# Patient Record
Sex: Female | Born: 1975 | Race: White | Hispanic: No | Marital: Married | Smoking: Current some day smoker
Health system: Southern US, Community
[De-identification: ages and names within clinical notes are randomized; demographics above are authoritative.]

## PROBLEM LIST (undated history)

## (undated) DIAGNOSIS — J309 Allergic rhinitis, unspecified: Secondary | ICD-10-CM

## (undated) HISTORY — PX: WISDOM TOOTH EXTRACTION: SHX21

## (undated) HISTORY — PX: CHOLECYSTECTOMY: SHX55

## (undated) HISTORY — DX: Allergic rhinitis, unspecified: J30.9

---

## 2009-05-10 ENCOUNTER — Ambulatory Visit (HOSPITAL_COMMUNITY): Admission: RE | Admit: 2009-05-10 | Discharge: 2009-05-10 | Payer: Self-pay | Admitting: Obstetrics and Gynecology

## 2009-05-28 ENCOUNTER — Ambulatory Visit (HOSPITAL_COMMUNITY): Admission: RE | Admit: 2009-05-28 | Discharge: 2009-05-28 | Payer: Self-pay | Admitting: Obstetrics and Gynecology

## 2009-06-13 ENCOUNTER — Ambulatory Visit (HOSPITAL_COMMUNITY): Admission: RE | Admit: 2009-06-13 | Discharge: 2009-06-13 | Payer: Self-pay | Admitting: Obstetrics and Gynecology

## 2009-06-26 ENCOUNTER — Ambulatory Visit (HOSPITAL_COMMUNITY): Admission: RE | Admit: 2009-06-26 | Discharge: 2009-06-26 | Payer: Self-pay | Admitting: Obstetrics and Gynecology

## 2009-07-12 ENCOUNTER — Ambulatory Visit (HOSPITAL_COMMUNITY): Admission: RE | Admit: 2009-07-12 | Discharge: 2009-07-12 | Payer: Self-pay | Admitting: Obstetrics and Gynecology

## 2009-08-07 ENCOUNTER — Ambulatory Visit (HOSPITAL_COMMUNITY): Admission: RE | Admit: 2009-08-07 | Discharge: 2009-08-07 | Payer: Self-pay | Admitting: Obstetrics and Gynecology

## 2009-09-03 ENCOUNTER — Ambulatory Visit (HOSPITAL_COMMUNITY): Admission: RE | Admit: 2009-09-03 | Discharge: 2009-09-03 | Payer: Self-pay | Admitting: Obstetrics and Gynecology

## 2009-10-09 ENCOUNTER — Inpatient Hospital Stay (HOSPITAL_COMMUNITY): Admission: AD | Admit: 2009-10-09 | Discharge: 2009-10-09 | Payer: Self-pay | Admitting: Obstetrics and Gynecology

## 2009-10-10 ENCOUNTER — Inpatient Hospital Stay (HOSPITAL_COMMUNITY): Admission: AD | Admit: 2009-10-10 | Discharge: 2009-10-14 | Payer: Self-pay | Admitting: Obstetrics and Gynecology

## 2009-10-11 ENCOUNTER — Encounter (INDEPENDENT_AMBULATORY_CARE_PROVIDER_SITE_OTHER): Payer: Self-pay | Admitting: Obstetrics and Gynecology

## 2010-03-27 LAB — CBC
HCT: 32 % — ABNORMAL LOW (ref 36.0–46.0)
HCT: 32 % — ABNORMAL LOW (ref 36.0–46.0)
Hemoglobin: 10.8 g/dL — ABNORMAL LOW (ref 12.0–15.0)
Hemoglobin: 14.1 g/dL (ref 12.0–15.0)
MCH: 30.5 pg (ref 26.0–34.0)
MCHC: 33.8 g/dL (ref 30.0–36.0)
MCV: 90.2 fL (ref 78.0–100.0)
Platelets: 226 10*3/uL (ref 150–400)
Platelets: 255 10*3/uL (ref 150–400)
RBC: 3.55 MIL/uL — ABNORMAL LOW (ref 3.87–5.11)
RDW: 14.1 % (ref 11.5–15.5)
WBC: 16 10*3/uL — ABNORMAL HIGH (ref 4.0–10.5)

## 2011-06-24 IMAGING — US US OB FOLLOW-UP
1 series · 18 of 28 positions shown · non-contrast
Comparison: none

OBSTETRICAL ULTRASOUND:
 This ultrasound was performed in The [HOSPITAL], and the [facility] report will be stored to [REDACTED] PACS.  This report is also available in [HOSPITAL]?s accessANYware.

[Series 1: us ob follow-up · 18 of 40 slices shown]
[im 1/40]
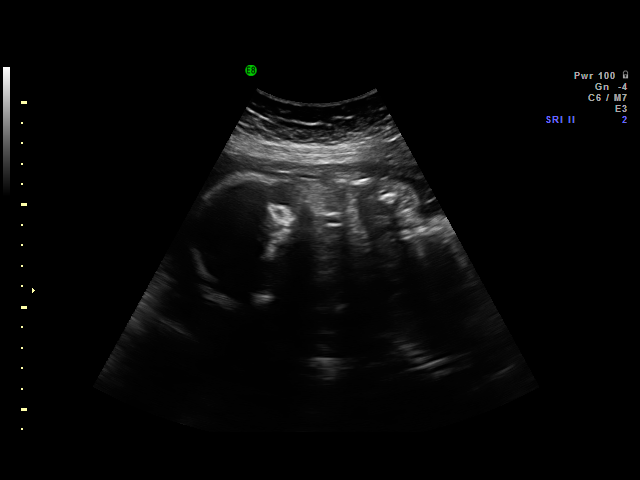
[im 3/40]
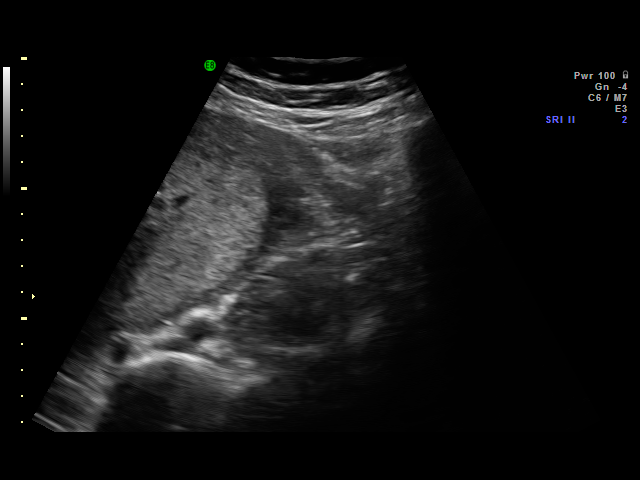
[im 5/40]
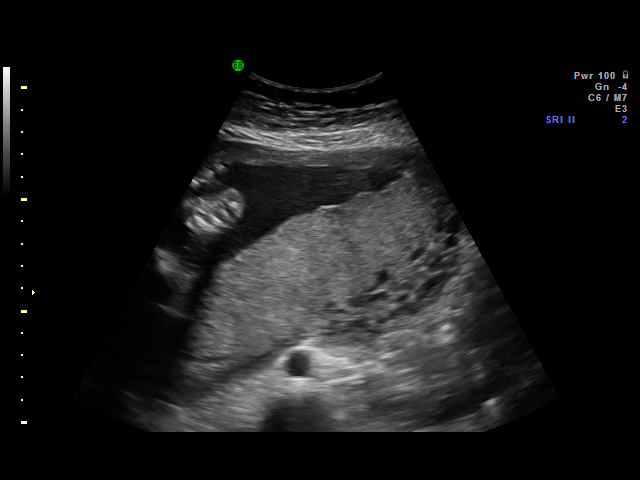
[im 8/40]
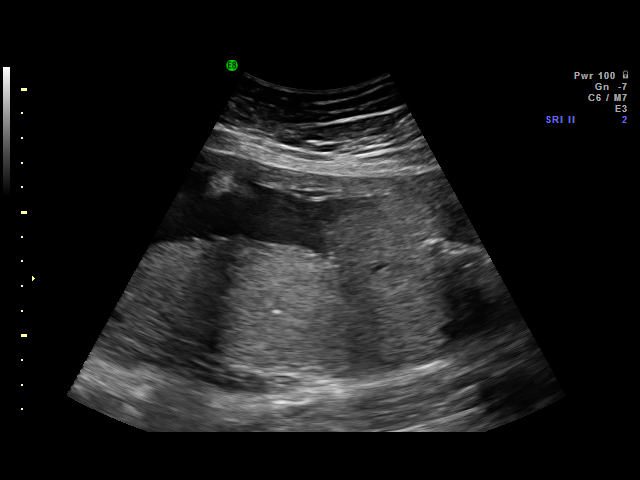
[im 11/40]
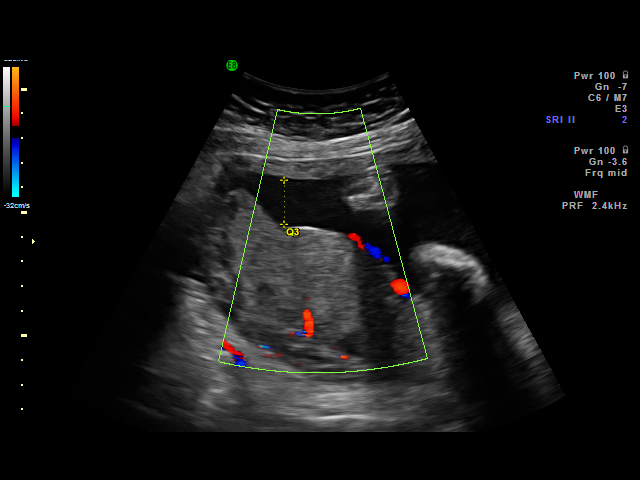
[im 12/40]
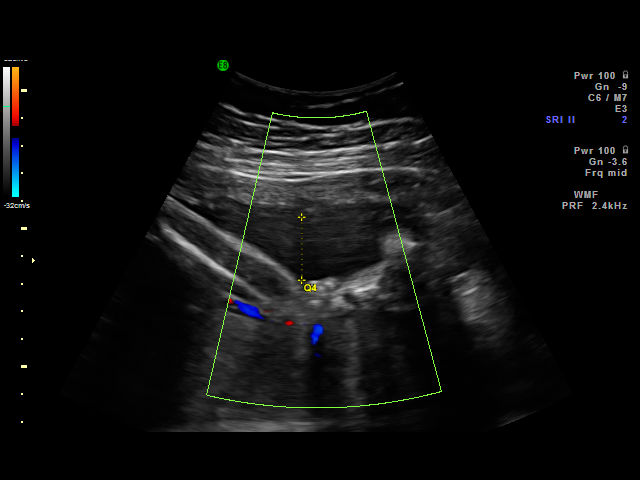
[im 15/40]
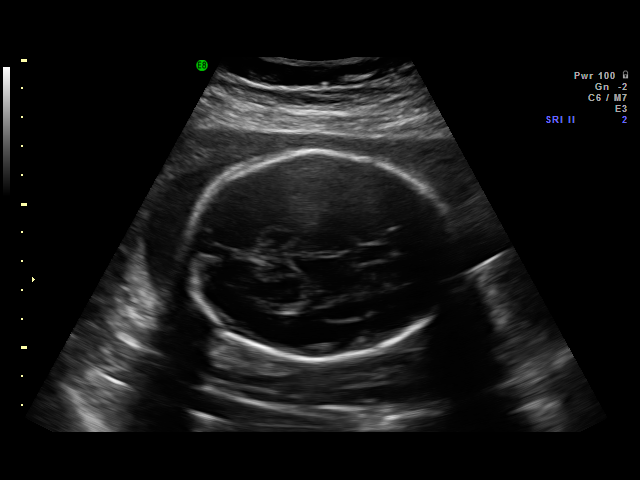
[im 16/40]
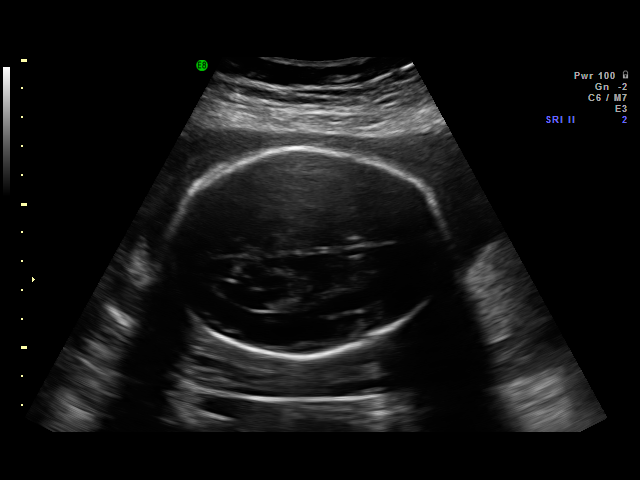
[im 19/40]
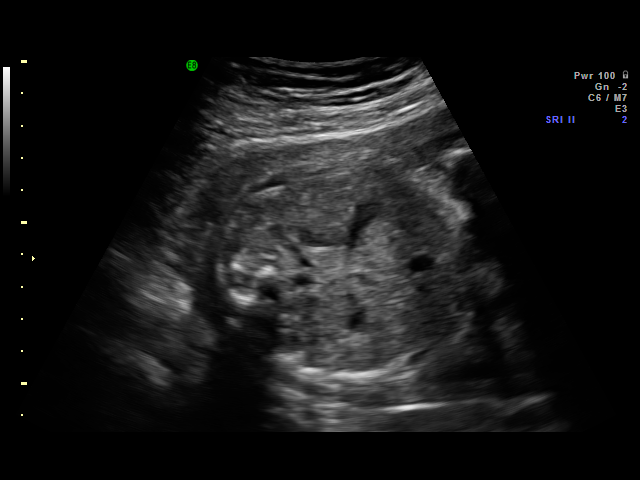
[im 21/40]
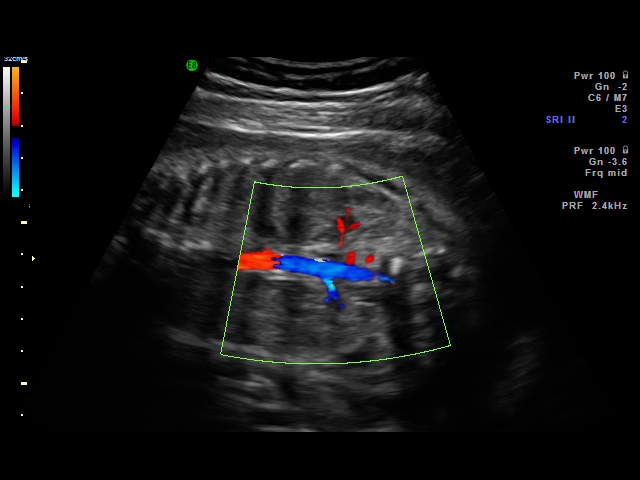
[im 24/40]
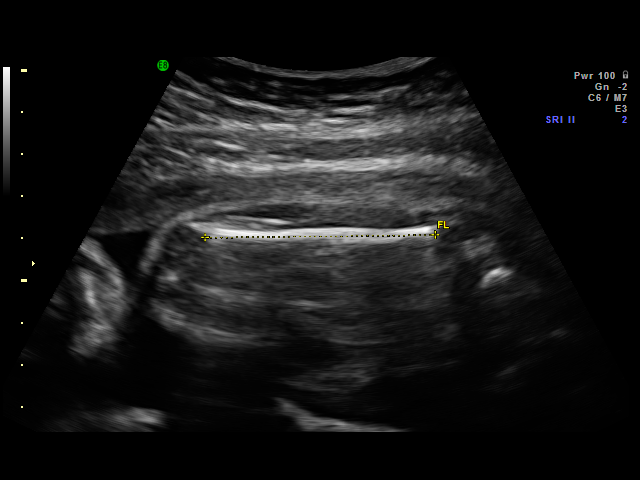
[im 25/40]
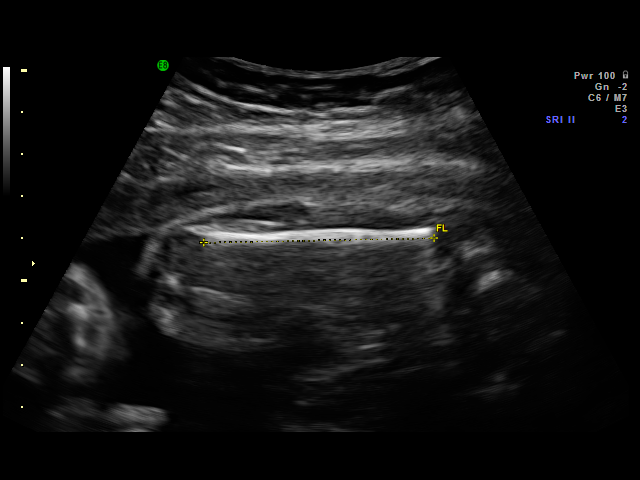
[im 28/40]
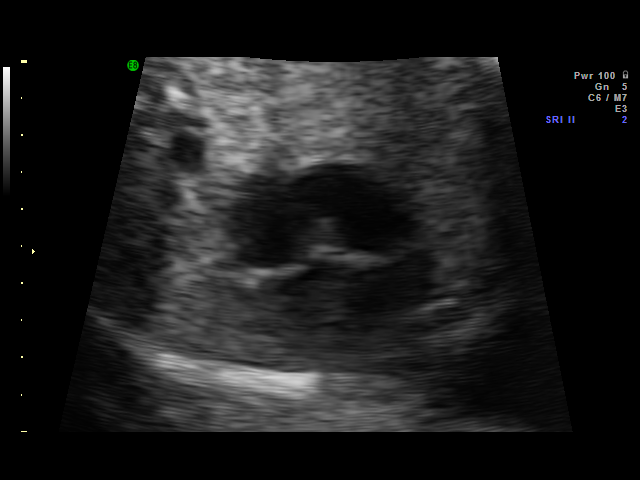
[im 31/40]
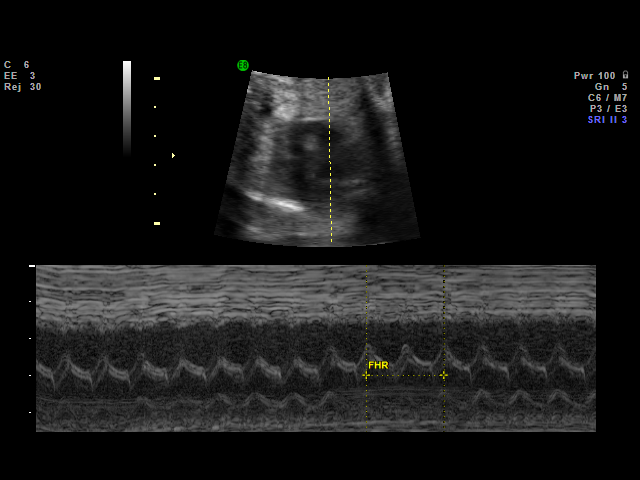
[im 32/40]
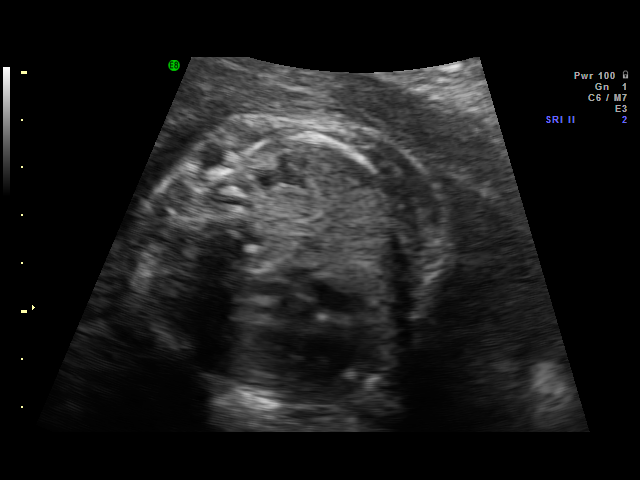
[im 35/40]
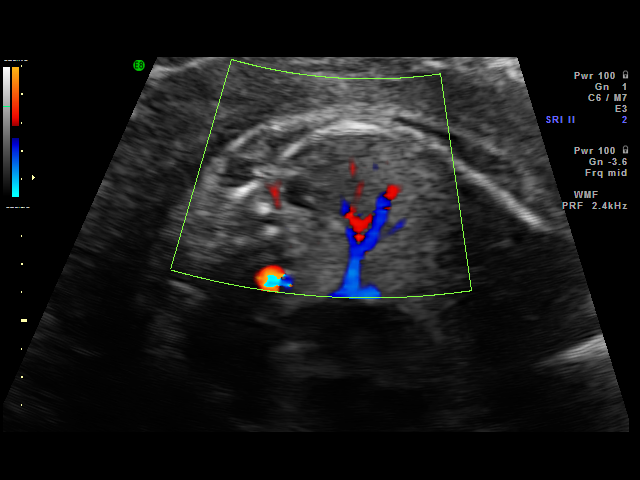
[im 37/40]
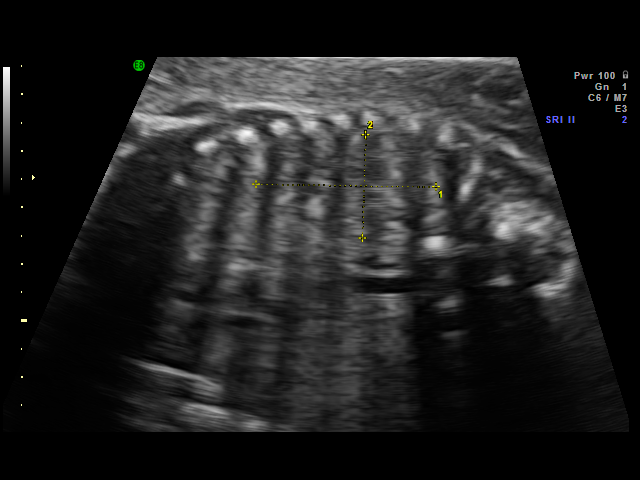
[im 40/40]
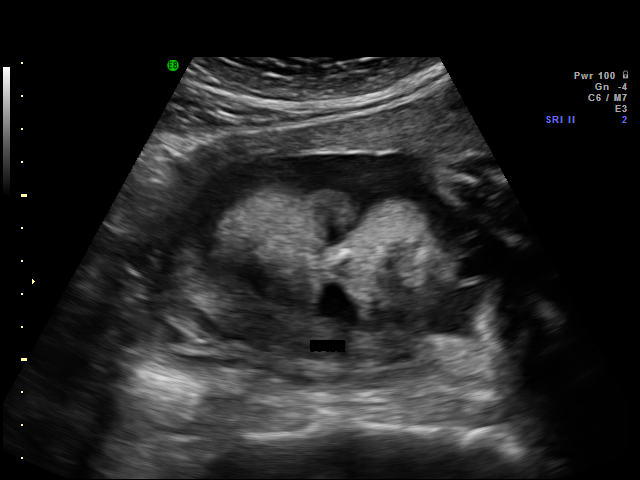

[18 of 28 positions shown; findings below may reference images not displayed]

IMPRESSION: [facility] has also been faxed to the ordering physician.

## 2011-07-20 IMAGING — US US OB FOLLOW-UP
2 series · 14 of 28 positions shown · non-contrast
Comparison: none

OBSTETRICAL ULTRASOUND:
 This ultrasound was performed in The [HOSPITAL], and the [facility] report will be stored to [REDACTED] PACS.  This report is also available in [HOSPITAL]?s accessANYware.

[Series 1: us ob follow-up · 0.27mm/px · 12 of 27 slices shown (1 of 2)]
[im 2/27]
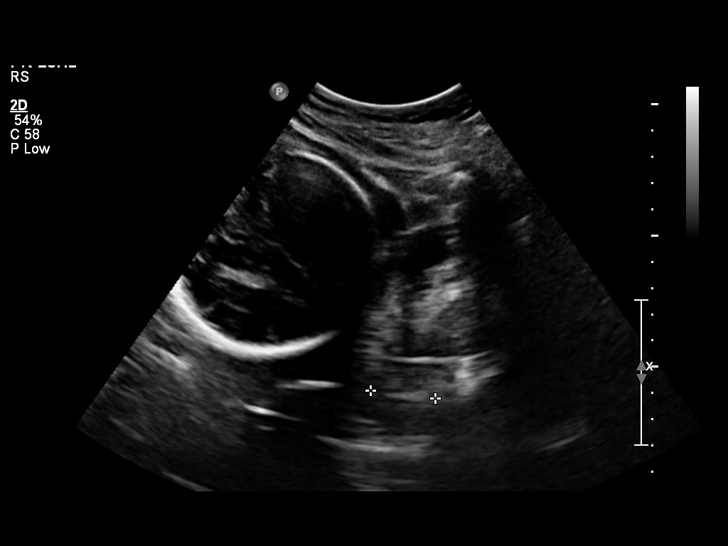
[im 4/27]
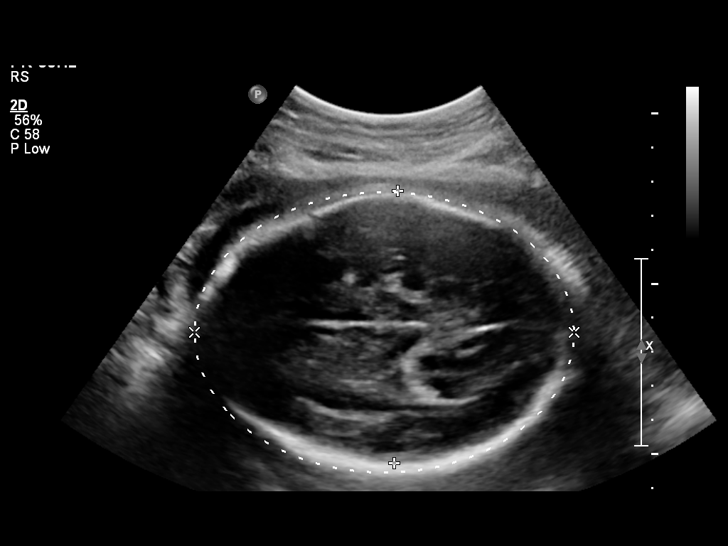
[im 6/27]
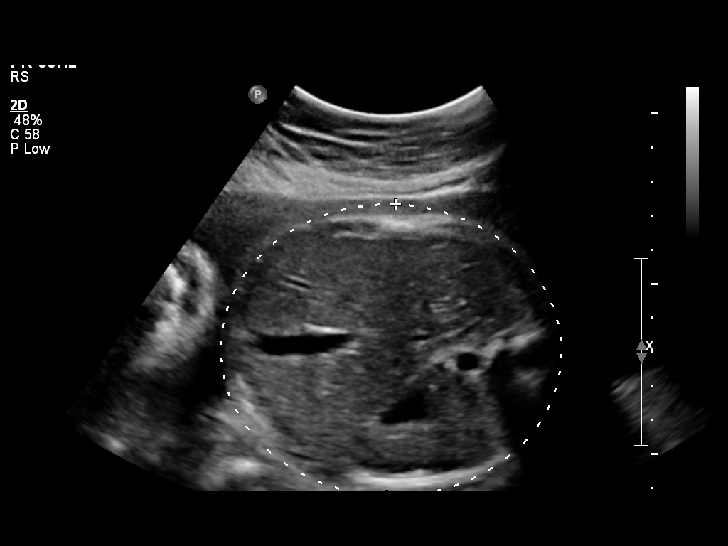
[im 8/27]
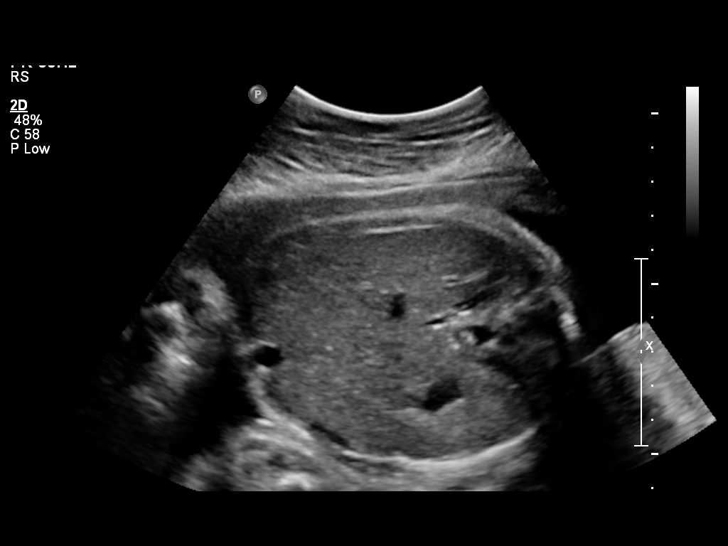
[im 11/27]
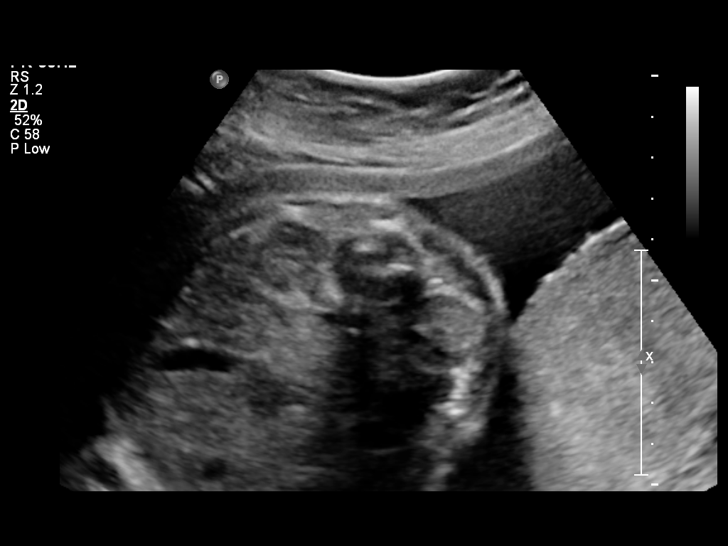
[im 13/27]
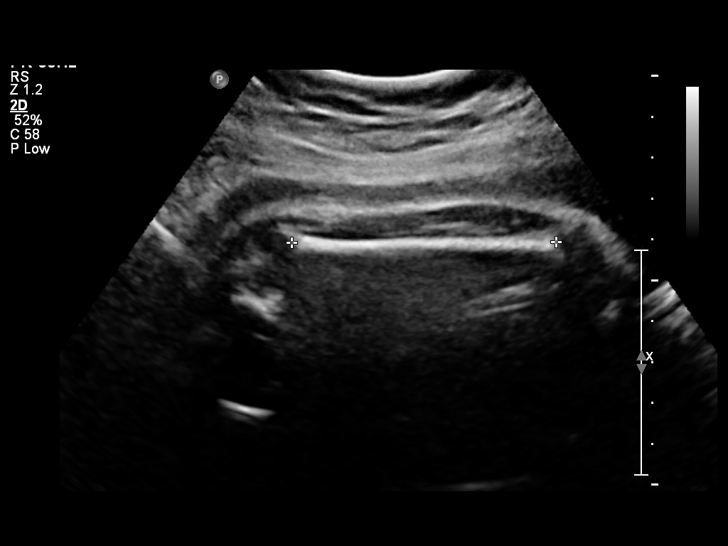
[im 15/27]
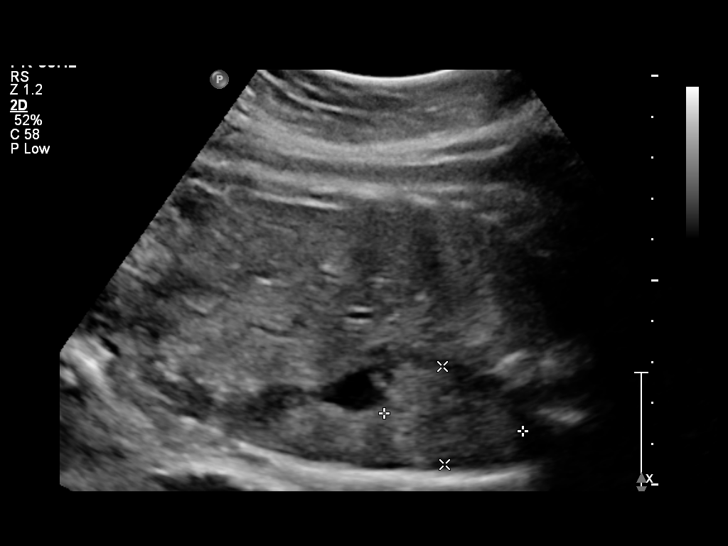
[im 17/27]
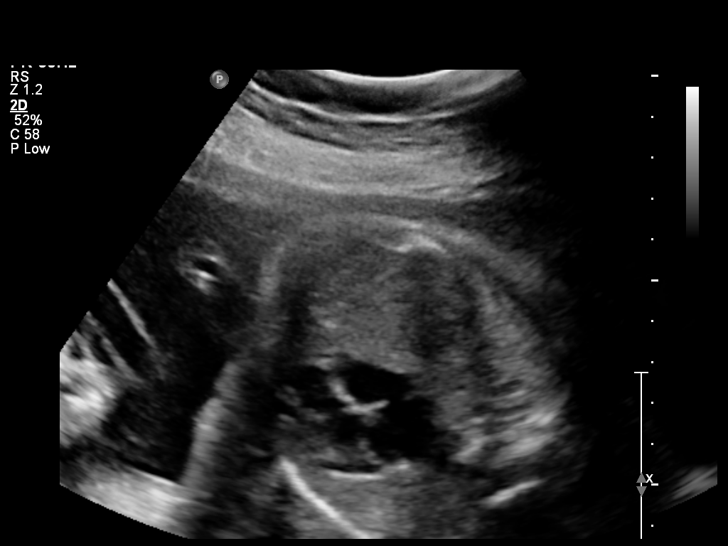
[im 20/27]
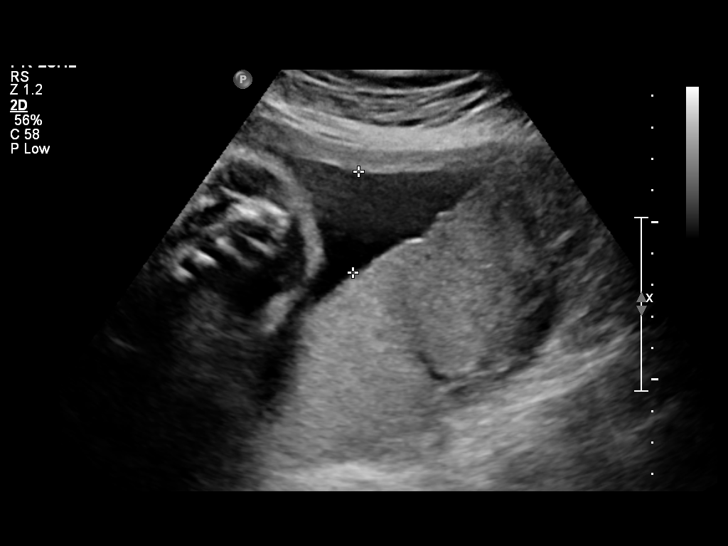
[im 22/27]
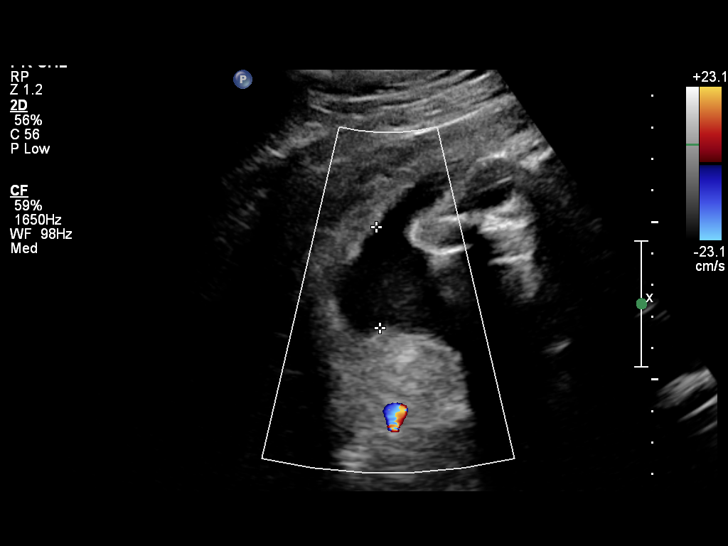
[im 24/27]
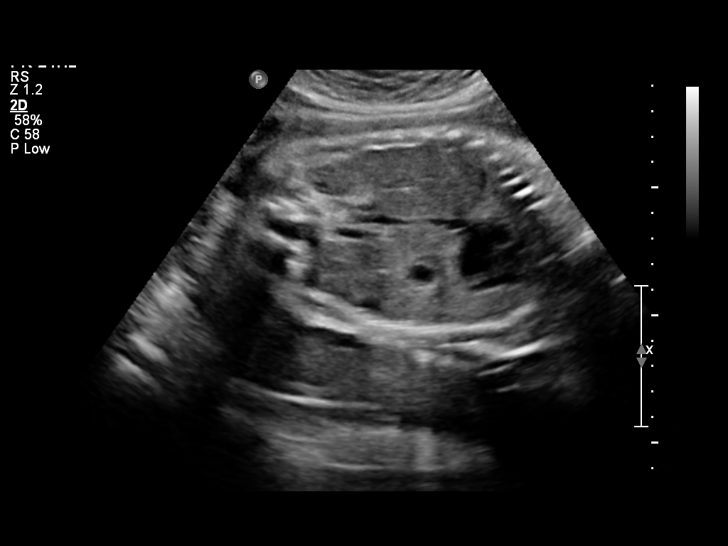
[im 27/27]
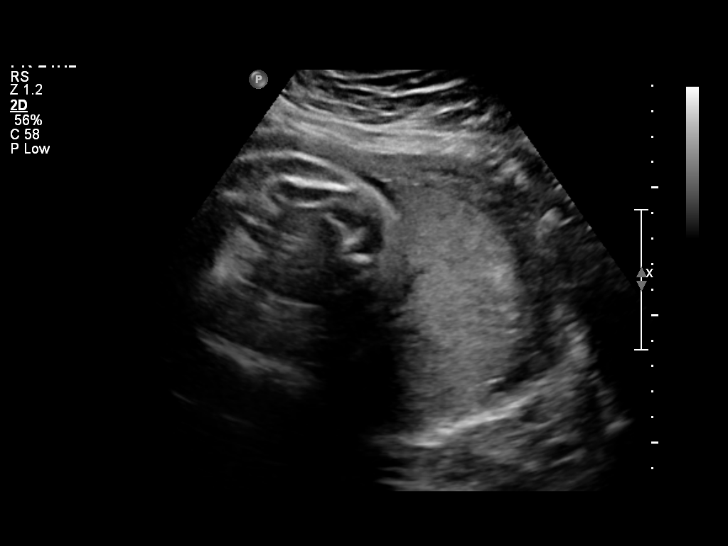

[Series 1: us ob follow-up · 0.15mm/px · 2 of 4 slices shown (2 of 2)]
[im 2/4]
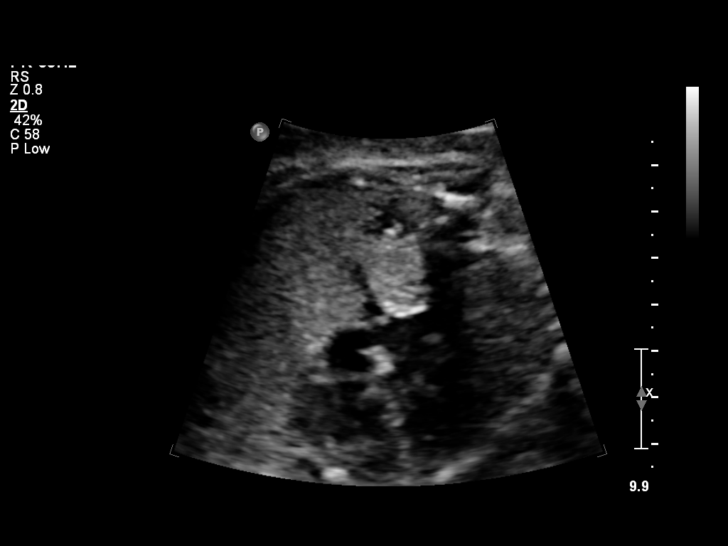
[im 4/4]
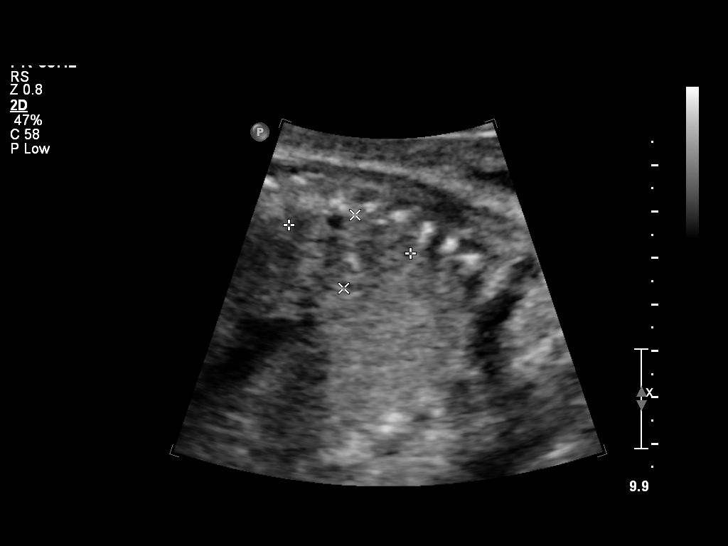

[14 of 28 positions shown; findings below may reference images not displayed]

IMPRESSION: [facility] has also been faxed to the ordering physician.

## 2022-01-13 NOTE — Progress Notes (Unsigned)
   NEW PATIENT Date of Service/Encounter:  01/13/22 Referring provider: none-self referred Primary care provider: No primary care provider on file.  Subjective:  Emma Barber is a 47 y.o. female with a PMHx of *** presenting today for evaluation of *** History obtained from: chart review and {Persons; PED relatives w/patient:19415::"patient"}.   *** Other allergy screening: Asthma: {Blank single:19197::"yes","no"} Rhino conjunctivitis: {Blank single:19197::"yes","no"} Food allergy: {Blank single:19197::"yes","no"} Medication allergy: {Blank single:19197::"yes","no"} Hymenoptera allergy: {Blank single:19197::"yes","no"} Urticaria: {Blank single:19197::"yes","no"} Eczema:{Blank single:19197::"yes","no"} History of recurrent infections suggestive of immunodeficency: {Blank single:19197::"yes","no"} ***Vaccinations are up to date.   Past Medical History: No past medical history on file. Medication List:  No current outpatient medications on file.   No current facility-administered medications for this visit.   Known Allergies:  Not on File Past Surgical History: *** The histories are not reviewed yet. Please review them in the "History" navigator section and refresh this [place]. Family History: No family history on file. Social History: Emma Barber lives ***.   ROS:  All other systems negative except as noted per HPI.  Objective:  There were no vitals taken for this visit. There is no height or weight on file to calculate BMI. Physical Exam:  General Appearance:  Alert, cooperative, no distress, appears stated age  Head:  Normocephalic, without obvious abnormality, atraumatic  Eyes:  Conjunctiva clear, EOM's intact  Nose: Nares normal, {Blank multiple:19196:a:"***","hypertrophic turbinates","normal mucosa","no visible anterior polyps","septum midline"}  Throat: Lips, tongue normal; teeth and gums normal, {Blank multiple:19196:a:"***","normal posterior  oropharynx","tonsils 2+","tonsils 3+","no tonsillar exudate","+ cobblestoning"}  Neck: Supple, symmetrical  Lungs:   {Blank multiple:19196:a:"***","clear to auscultation bilaterally","end-expiratory wheezing","wheezing throughout"}, Respirations unlabored, {Blank multiple:19196:a:"***","no coughing","intermittent dry coughing"}  Heart:  {Blank multiple:19196:a:"***","regular rate and rhythm","no murmur"}, Appears well perfused  Extremities: No edema  Skin: Skin color, texture, turgor normal, no rashes or lesions on visualized portions of skin  Neurologic: No gross deficits     Diagnostics: Spirometry:  Tracings reviewed. Her effort: {Blank single:19197::"Good reproducible efforts.","It was hard to get consistent efforts and there is a question as to whether this reflects a maximal maneuver.","Poor effort, data can not be interpreted.","Variable effort-results affected.","decent for first attempt at spirometry."} FVC: ***L (pre), ***L  (post) FEV1: ***L, ***% predicted (pre), ***L, ***% predicted (post) FEV1/FVC ratio: *** (pre), *** (post) Interpretation: {Blank single:19197::"Spirometry consistent with mild obstructive disease","Spirometry consistent with moderate obstructive disease","Spirometry consistent with severe obstructive disease","Spirometry consistent with possible restrictive disease","Spirometry consistent with mixed obstructive and restrictive disease","Spirometry uninterpretable due to technique","Spirometry consistent with normal pattern","No overt abnormalities noted given today's efforts"} with *** bronchodilator response  Skin Testing: {Blank single:19197::"Select foods","Environmental allergy panel","Environmental allergy panel and select foods","Food allergy panel","None","Deferred due to recent antihistamines use"}. *** Adequate controls. Results discussed with patient/family.   {Blank single:19197::"Allergy testing results were read and interpreted by myself, documented  by clinical staff."," "}  Assessment and Plan  ***  {Blank single:19197::"This note in its entirety was forwarded to the Provider who requested this consultation."}  Thank you for your kind referral. I appreciate the opportunity to take part in Emma Barber's care. Please do not hesitate to contact me with questions.***  Sincerely,  Sigurd Sos, MD [facility]

## 2022-01-15 ENCOUNTER — Encounter: Payer: Self-pay | Admitting: Internal Medicine

## 2022-01-15 ENCOUNTER — Ambulatory Visit: Payer: BC Managed Care – PPO | Admitting: Internal Medicine

## 2022-01-15 VITALS — HR 103 | Temp 97.7°F | Resp 18 | Ht 65.25 in | Wt 162.7 lb

## 2022-01-15 DIAGNOSIS — J302 Other seasonal allergic rhinitis: Secondary | ICD-10-CM | POA: Insufficient documentation

## 2022-01-15 DIAGNOSIS — J3089 Other allergic rhinitis: Secondary | ICD-10-CM

## 2022-01-15 DIAGNOSIS — H1013 Acute atopic conjunctivitis, bilateral: Secondary | ICD-10-CM

## 2022-01-15 HISTORY — PX: OTHER SURGICAL HISTORY: SHX169

## 2022-01-15 MED ORDER — EPINEPHRINE 0.3 MG/0.3ML IJ SOAJ: 0.3000 mg | INTRAMUSCULAR | 2 refills | Status: AC | PRN

## 2022-01-15 NOTE — Patient Instructions (Signed)
Chronic Rhinitis Seasonal and Perennial Allergic: - allergy testing today: skin testing positive to ragweed, marshelder (weed), dust mites, cat dander, dog dander, and outdoor mold (alternaria) IDs positive to indoor molds (penicillium, aspergillus)  - Prevention:  - allergen avoidance when possible - consider allergy shots as long term control of your symptoms by teaching your immune system to be more tolerant of your allergy triggers  - Symptom control: - Start Nasal Steroid Spray: Best results if used daily. - Options include Flonase (fluticasone), Nasocort (triamcinolone), Nasonex (mometasome) 1- 2 sprays in each nostril daily.  - All can be purchased over-the-counter if not covered by insurance. - Consider Astelin (azelastine) 1-2 sprays in each nostril twice a day as needed for nasal congestion/itchy nose - Continue Antihistamine: daily or daily as needed.   -Options include Zyrtec (Cetirizine) 10mg , Claritin (Loratadine) 10mg , Allegra (Fexofenadine) 180mg , or Xyzal (Levocetirinze) 5mg  - Can be purchased over-the-counter if not covered by insurance.  Allergic Conjunctivitis:  - Consider Allergy Eye drops-great options include Pataday (Olopatadine) or Zaditor (ketotifen) for eye symptoms daily as needed-both sold over the counter if not covered by insurance.  -Avoid eye drops that say red eye relief as they may contain medications that dry out your eyes.  Follow up : 3 months with provider, sooner for allergy injections-   Call insurance with codes to ensure coverage, call us back to let us know if you prefer RUSH (rapid build-up) or traditional  It was a pleasure meeting you in clinic today! Thank you for allowing me to participate in your care.  Sigurd Sos, MD [facility]  DUST MITE AVOIDANCE MEASURES:  There are three main measures that need and can be taken to avoid house dust mites:  Reduce accumulation of dust in general -reduce furniture, clothing,  carpeting, books, stuffed animals, especially in bedroom  Separate yourself from the dust -use pillow and mattress encasements (can be found at stores such as [employer] or online) -avoid direct exposure to air condition flow -use a HEPA filter device, especially in the bedroom; you can also use a HEPA filter vacuum cleaner -wipe dust with a moist towel instead of a dry towel or broom when cleaning  Decrease mites and/or their secretions -wash clothing and linen and stuffed animals at highest temperature possible, at least every 2 weeks -stuffed animals can also be placed in a bag and put in a freezer overnight  Despite the above measures, it is impossible to eliminate dust mites or their allergen completely from your home.  With the above measures the burden of mites in your home can be diminished, with the goal of minimizing your allergic symptoms.  Success will be reached only when implementing and using all means together. Control of Dog or Cat Allergen  Avoidance is the best way to manage a dog or cat allergy. If you have a dog or cat and are allergic to dog or cats, consider removing the dog or cat from the home. If you have a dog or cat but don't want to find it a new home, or if your family wants a pet even though someone in the household is allergic, here are some strategies that may help keep symptoms at bay:  Keep the pet out of your bedroom and restrict it to only a few rooms. Be advised that keeping the dog or cat in only one room will not limit the allergens to that room. Don't pet, hug or kiss the  dog or cat; if you do, wash your hands with soap and water. High-efficiency particulate air (HEPA) cleaners run continuously in a bedroom or living room can reduce allergen levels over time. Regular use of a high-efficiency vacuum cleaner or a central vacuum can reduce allergen levels. Giving your dog or cat a bath at least once a week can reduce airborne allergen. Control  of Mold Allergen   Mold and fungi can grow on a variety of surfaces provided certain temperature and moisture conditions exist.  Outdoor molds grow on plants, decaying vegetation and soil.  The major outdoor mold, Alternaria and Cladosporium, are found in very high numbers during hot and dry conditions.  Generally, a late Summer - Fall peak is seen for common outdoor fungal spores.  Rain will temporarily lower outdoor mold spore count, but counts rise rapidly when the rainy period ends.  The most important indoor molds are Aspergillus and Penicillium.  Dark, humid and poorly ventilated basements are ideal sites for mold growth.  The next most common sites of mold growth are the bathroom and the kitchen.  Outdoor (Seasonal) Mold Control  Use air conditioning and keep windows closed Avoid exposure to decaying vegetation. Avoid leaf raking. Avoid grain handling. Consider wearing a face mask if working in moldy areas.    Indoor (Perennial) Mold Control   Maintain humidity below 50%. Clean washable surfaces with 5% bleach solution. Remove sources e.g. contaminated carpets.   Reducing Pollen Exposure  The American Academy of Allergy, Asthma and Immunology suggests the following steps to reduce your exposure to pollen during allergy seasons.    Do not hang sheets or clothing out to dry; pollen may collect on these items. Do not mow lawns or spend time around freshly cut grass; mowing stirs up pollen. Keep windows closed at night.  Keep car windows closed while driving. Minimize morning activities outdoors, a time when pollen counts are usually at their highest. Stay indoors as much as possible when pollen counts or humidity is high and on windy days when pollen tends to remain in the air longer. Use air conditioning when possible.  Many air conditioners have filters that trap the pollen spores. Use a HEPA room air filter to remove pollen form the indoor air you breathe.

## 2022-01-19 DIAGNOSIS — J3081 Allergic rhinitis due to animal (cat) (dog) hair and dander: Secondary | ICD-10-CM

## 2022-01-19 NOTE — Progress Notes (Signed)
Aeroallergen Immunotherapy   Ordering Provider: Dr. Sigurd Sos   Patient Details  Name: Emma Barber  MRN: [id number]  Date of Birth: 15-May-1975   Order 2 of 2   Vial Label: DM-C-M   0.2 ml (Volume)  1:20 Concentration -- Alternaria alternata  0.2 ml (Volume)  1:10 Concentration -- Aspergillus mix  0.2 ml (Volume)  1:10 Concentration -- Penicillium mix  0.5 ml (Volume)  1:10 Concentration -- Cat Hair  0.5 ml (Volume)  1:10 Concentration -- Dog Epithelia  0.5 ml (Volume)   AU Concentration -- Mite Mix (DF 5,000 & DP 5,000)    2.1  ml Extract Subtotal  2.9  ml Diluent  5.0  ml Maintenance Total   Schedule:  RUSH  Silver (1:million) RUSH Blue Vial (1:100,000): RUSH  Yellow Vial (1:10,000): RUSH  Green Vial (1:1,000): Schedule B (6 doses)  Red Vial (1:100): Schedule A (10 doses)   Special Instructions: normal B

## 2022-01-19 NOTE — Progress Notes (Signed)
VIALS EXP 01-20-23.  PT IS RUSH AIT.

## 2022-01-19 NOTE — Progress Notes (Signed)
Aeroallergen Immunotherapy   Ordering Provider: Dr. Sigurd Sos   Patient Details  Name: Emma Barber  MRN: [id number]  Date of Birth: [birth year]   Order 1 of 2   Vial Label: W-Dog   0.3 ml (Volume)  1:20 Concentration -- Ragweed Mix  0.2 ml (Volume)  1:20 Concentration -- Burweed Marshelder  0.5 ml (Volume)  1:10 Concentration -- Dog Epithelia    1.0  ml Extract Subtotal  4.0  ml Diluent  5.0  ml Maintenance Total   Schedule:  RUSH Silver (1:million) RUSH  Blue Vial (1:100,000): RUSH  Yellow Vial (1:10,000): RUSH  Green Vial (1:1,000): Schedule B (6 doses)  Red Vial (1:100): Schedule A (10 doses)   Special Instructions: normal B

## 2022-01-20 DIAGNOSIS — J3089 Other allergic rhinitis: Secondary | ICD-10-CM

## 2022-02-04 ENCOUNTER — Other Ambulatory Visit: Payer: Self-pay

## 2022-02-04 MED ORDER — MONTELUKAST SODIUM 10 MG PO TABS: 10.0000 mg | ORAL_TABLET | Freq: Every day | ORAL | 0 refills | Status: DC

## 2022-02-04 MED ORDER — PREDNISONE 20 MG PO TABS: ORAL_TABLET | ORAL | 0 refills | Status: DC

## 2022-02-04 MED ORDER — FAMOTIDINE 20 MG PO TABS: 20.0000 mg | ORAL_TABLET | Freq: Two times a day (BID) | ORAL | 0 refills | Status: AC

## 2022-02-04 NOTE — Telephone Encounter (Signed)
Pre-meds sent to [facility] and pt notify.

## 2022-02-05 NOTE — Progress Notes (Signed)
RAPID DESENSITIZATION Note  RE: Emma Barber MRN: [id number] DOB: 30-Jun-1975 Date of Office Visit: 02/06/2022  Subjective:  Patient presents today for rapid desensitization.  Interval History: Patient has not been ill, she has taken all premedications as per protocol.  Recent/Current History: Pulmonary disease: no Cardiac disease: no Respiratory infection: no Rash: no Itch: no Swelling: no Cough: no Shortness of breath: no Runny/stuffy nose: no Itchy eyes: no Beta-blocker use: no  Patient/guardian was informed of the procedure with verbalized understanding of the risk of anaphylaxis. Consent has been signed.   Medication List:  Current Outpatient Medications  Medication Sig Dispense Refill   B Complex Vitamins (VITAMIN B COMPLEX PO) Take by mouth.     EPINEPHrine (EPIPEN 2-PAK) 0.3 mg/0.3 mL IJ SOAJ injection Inject 0.3 mg into the muscle as needed for anaphylaxis. 1 each 2   fluticasone (FLONASE) 50 MCG/ACT nasal spray Place into the nose.     levocetirizine (XYZAL) 5 MG tablet Take 1 tablet by mouth daily.     levonorgestrel (MIRENA) 20 MCG/DAY IUD by Intrauterine route.     famotidine (PEPCID) 20 MG tablet Take 1 tablet (20 mg total) by mouth 2 (two) times daily. (Patient not taking: Reported on 02/06/2022) 4 tablet 0   montelukast (SINGULAIR) 10 MG tablet Take 1 tablet (10 mg total) by mouth at bedtime. (Patient not taking: Reported on 02/06/2022) 2 tablet 0   predniSONE (DELTASONE) 20 MG tablet Please follow Pre-med's instructions, take 40 mg morning before appt and the 40 mg morning of appt. (Patient not taking: Reported on 02/06/2022) 4 tablet 0   No current facility-administered medications for this visit.   Allergies: Allergies  Allergen Reactions   Montelukast Other (See Comments)    Other Reaction(s): Other (See Comments)  Depression   I reviewed her past medical history, social history, family history, and environmental history and no significant changes  have been reported from her previous visit.  ROS: Negative except as per HPI.  Objective: BP 116/62   Pulse 98   Temp 98.1 F (36.7 C) (Temporal)   Resp 18   SpO2 98%  There is no height or weight on file to calculate BMI.   General Appearance:  Alert, cooperative, no distress, appears stated age  Head:  Normocephalic, without obvious abnormality, atraumatic  Eyes:  Conjunctiva clear, EOM's intact  Nose: Nares normal  Throat: Lips, tongue normal; teeth and gums normal, normal posterior oropharnyx  Neck: Supple, symmetrical  Lungs:   CTAB, Respirations unlabored, no coughing  Heart:  RRR, no murmur, Appears well perfused  Extremities: No edema  Skin: Skin color, texture, turgor normal, no rashes or lesions on visualized portions of skin  Neurologic: No gross deficits     Diagnostics:  PROCEDURES:  Patient received the following doses every hour: Step 1:  0.12ml - 1:1,000,000 dilution (silver vial) Step 2:  0.23ml - 1:1,000,000 dilution (silver vial) Step 3: 0.47ml - 1:100,000 dilution (blue vial)  Step 4: 0.110ml - 1:100,000 dilution (blue vial)  Step 5: 0.59ml - 1:10,000 dilution (gold vial) Step 6: 0.45ml - 1:10,000 dilution (gold vial) Step 7: 0.82ml - 1:10,000 dilution (gold vial) Step 8: 0.76ml - 1:10,000 dilution (gold vial)  Patient was observed for 1 hour after the last dose.   Procedure started at 8:47 AM Procedure ended at 4:06 PM   ASSESSMENT/PLAN:   Patient has tolerated the rapid desensitization protocol.  Next appointment: Start at 0.25ml of 1:1000 dilution (green vial) and build up per protocol.

## 2022-02-06 ENCOUNTER — Encounter: Payer: Self-pay | Admitting: Internal Medicine

## 2022-02-06 ENCOUNTER — Ambulatory Visit: Payer: BC Managed Care – PPO | Admitting: Internal Medicine

## 2022-02-06 VITALS — BP 120/76 | HR 94 | Temp 97.9°F | Resp 18

## 2022-02-06 DIAGNOSIS — J3089 Other allergic rhinitis: Secondary | ICD-10-CM | POA: Diagnosis not present

## 2022-02-06 DIAGNOSIS — J302 Other seasonal allergic rhinitis: Secondary | ICD-10-CM

## 2022-02-13 ENCOUNTER — Ambulatory Visit (INDEPENDENT_AMBULATORY_CARE_PROVIDER_SITE_OTHER): Payer: BC Managed Care – PPO

## 2022-02-13 DIAGNOSIS — J309 Allergic rhinitis, unspecified: Secondary | ICD-10-CM | POA: Diagnosis not present

## 2022-02-16 ENCOUNTER — Ambulatory Visit (INDEPENDENT_AMBULATORY_CARE_PROVIDER_SITE_OTHER): Payer: BC Managed Care – PPO

## 2022-02-16 DIAGNOSIS — J309 Allergic rhinitis, unspecified: Secondary | ICD-10-CM | POA: Diagnosis not present

## 2022-02-25 ENCOUNTER — Ambulatory Visit (INDEPENDENT_AMBULATORY_CARE_PROVIDER_SITE_OTHER): Payer: BC Managed Care – PPO

## 2022-02-25 DIAGNOSIS — J309 Allergic rhinitis, unspecified: Secondary | ICD-10-CM | POA: Diagnosis not present

## 2022-03-02 ENCOUNTER — Ambulatory Visit (INDEPENDENT_AMBULATORY_CARE_PROVIDER_SITE_OTHER): Payer: BC Managed Care – PPO

## 2022-03-02 DIAGNOSIS — J309 Allergic rhinitis, unspecified: Secondary | ICD-10-CM

## 2022-03-11 ENCOUNTER — Ambulatory Visit (INDEPENDENT_AMBULATORY_CARE_PROVIDER_SITE_OTHER): Payer: BC Managed Care – PPO

## 2022-03-11 DIAGNOSIS — J309 Allergic rhinitis, unspecified: Secondary | ICD-10-CM

## 2022-03-17 ENCOUNTER — Ambulatory Visit (INDEPENDENT_AMBULATORY_CARE_PROVIDER_SITE_OTHER): Payer: BC Managed Care – PPO

## 2022-03-17 DIAGNOSIS — J309 Allergic rhinitis, unspecified: Secondary | ICD-10-CM | POA: Diagnosis not present

## 2022-03-26 ENCOUNTER — Ambulatory Visit (INDEPENDENT_AMBULATORY_CARE_PROVIDER_SITE_OTHER): Payer: BC Managed Care – PPO

## 2022-03-26 DIAGNOSIS — J309 Allergic rhinitis, unspecified: Secondary | ICD-10-CM

## 2022-03-31 ENCOUNTER — Ambulatory Visit (INDEPENDENT_AMBULATORY_CARE_PROVIDER_SITE_OTHER): Payer: BC Managed Care – PPO

## 2022-03-31 DIAGNOSIS — J309 Allergic rhinitis, unspecified: Secondary | ICD-10-CM | POA: Diagnosis not present

## 2022-04-14 ENCOUNTER — Ambulatory Visit (INDEPENDENT_AMBULATORY_CARE_PROVIDER_SITE_OTHER): Payer: BC Managed Care – PPO

## 2022-04-14 DIAGNOSIS — J309 Allergic rhinitis, unspecified: Secondary | ICD-10-CM

## 2022-04-22 ENCOUNTER — Ambulatory Visit (INDEPENDENT_AMBULATORY_CARE_PROVIDER_SITE_OTHER): Payer: BC Managed Care – PPO | Admitting: *Deleted

## 2022-04-22 DIAGNOSIS — J309 Allergic rhinitis, unspecified: Secondary | ICD-10-CM | POA: Diagnosis not present

## 2022-04-27 ENCOUNTER — Ambulatory Visit: Payer: BC Managed Care – PPO | Admitting: Internal Medicine

## 2022-04-30 ENCOUNTER — Ambulatory Visit (INDEPENDENT_AMBULATORY_CARE_PROVIDER_SITE_OTHER): Payer: BC Managed Care – PPO

## 2022-04-30 DIAGNOSIS — J309 Allergic rhinitis, unspecified: Secondary | ICD-10-CM

## 2022-05-05 ENCOUNTER — Ambulatory Visit: Payer: BC Managed Care – PPO | Admitting: Internal Medicine

## 2022-05-05 ENCOUNTER — Encounter: Payer: Self-pay | Admitting: Internal Medicine

## 2022-05-05 VITALS — BP 120/70 | HR 76 | Temp 98.1°F | Resp 18

## 2022-05-05 DIAGNOSIS — J309 Allergic rhinitis, unspecified: Secondary | ICD-10-CM | POA: Diagnosis not present

## 2022-05-05 DIAGNOSIS — H1013 Acute atopic conjunctivitis, bilateral: Secondary | ICD-10-CM | POA: Diagnosis not present

## 2022-05-05 DIAGNOSIS — J302 Other seasonal allergic rhinitis: Secondary | ICD-10-CM

## 2022-05-05 NOTE — Patient Instructions (Addendum)
Seasonal and Perennial Allergic rhinitis: - allergen avoidance towards ragweed, marshelder (weed), dust mites, cat dander, dog dander, and outdoor mold (alternaria), indoor molds - Prevention:  - allergen avoidance when possible - continue allergy injections per protocol, bring updated epipen to your appointments - Symptom control: - Start Nasal Steroid Spray: Best results if used daily. - Options include Flonase (fluticasone), Nasocort (triamcinolone), Nasonex (mometasome) 1- 2 sprays in each nostril daily.  - All can be purchased over-the-counter if not covered by insurance. - Consider Astelin (azelastine) 1-2 sprays in each nostril twice a day as needed for nasal congestion/itchy nose - Continue Antihistamine: daily or daily as needed.   -Options include Zyrtec (Cetirizine) , Claritin (Loratadine) , Allegra (Fexofenadine) , or Xyzal (Levocetirinze)  - Can be purchased over-the-counter if not covered by insurance.  Allergic Conjunctivitis:  - Consider Allergy Eye drops-great options include Pataday (Olopatadine) or Zaditor (ketotifen) for eye symptoms daily as needed-both sold over the counter if not covered by insurance.  -Avoid eye drops that say red eye relief as they may contain medications that dry out your eyes.  Follow up : 6 months, sooner if needed It was a pleasure seeing you again in clinic today! Thank you for allowing me to participate in your care.  Tonny Bollman, MD [facility]  DUST MITE AVOIDANCE MEASURES:  There are three main measures that need and can be taken to avoid house dust mites:  Reduce accumulation of dust in general -reduce furniture, clothing, carpeting, books, stuffed animals, especially in bedroom  Separate yourself from the dust -use pillow and mattress encasements (can be found at stores such as [employer] or online) -avoid direct exposure to air condition flow -use a HEPA filter device, especially in  the bedroom; you can also use a HEPA filter vacuum cleaner -wipe dust with a moist towel instead of a dry towel or broom when cleaning  Decrease mites and/or their secretions -wash clothing and linen and stuffed animals at highest temperature possible, at least every 2 weeks -stuffed animals can also be placed in a bag and put in a freezer overnight  Despite the above measures, it is impossible to eliminate dust mites or their allergen completely from your home.  With the above measures the burden of mites in your home can be diminished, with the goal of minimizing your allergic symptoms.  Success will be reached only when implementing and using all means together. Control of Dog or Cat Allergen  Avoidance is the best way to manage a dog or cat allergy. If you have a dog or cat and are allergic to dog or cats, consider removing the dog or cat from the home. If you have a dog or cat but don't want to find it a new home, or if your family wants a pet even though someone in the household is allergic, here are some strategies that may help keep symptoms at bay:  Keep the pet out of your bedroom and restrict it to only a few rooms. Be advised that keeping the dog or cat in only one room will not limit the allergens to that room. Don't pet, hug or kiss the dog or cat; if you do, wash your hands with soap and water. High-efficiency particulate air (HEPA) cleaners run continuously in a bedroom or living room can reduce allergen levels over time. Regular use of a high-efficiency vacuum cleaner or a central vacuum can reduce allergen levels. Giving your dog or  cat a bath at least once a week can reduce airborne allergen. Control of Mold Allergen   Mold and fungi can grow on a variety of surfaces provided certain temperature and moisture conditions exist.  Outdoor molds grow on plants, decaying vegetation and soil.  The major outdoor mold, Alternaria and Cladosporium, are found in very high numbers during  hot and dry conditions.  Generally, a late Summer - Fall peak is seen for common outdoor fungal spores.  Rain will temporarily lower outdoor mold spore count, but counts rise rapidly when the rainy period ends.  The most important indoor molds are Aspergillus and Penicillium.  Dark, humid and poorly ventilated basements are ideal sites for mold growth.  The next most common sites of mold growth are the bathroom and the kitchen.  Outdoor (Seasonal) Mold Control  Use air conditioning and keep windows closed Avoid exposure to decaying vegetation. Avoid leaf raking. Avoid grain handling. Consider wearing a face mask if working in moldy areas.    Indoor (Perennial) Mold Control   Maintain humidity below 50%. Clean washable surfaces with 5% bleach solution. Remove sources e.g. contaminated carpets.   Reducing Pollen Exposure  The American Academy of Allergy, Asthma and Immunology suggests the following steps to reduce your exposure to pollen during allergy seasons.    Do not hang sheets or clothing out to dry; pollen may collect on these items. Do not mow lawns or spend time around freshly cut grass; mowing stirs up pollen. Keep windows closed at night.  Keep car windows closed while driving. Minimize morning activities outdoors, a time when pollen counts are usually at their highest. Stay indoors as much as possible when pollen counts or humidity is high and on windy days when pollen tends to remain in the air longer. Use air conditioning when possible.  Many air conditioners have filters that trap the pollen spores. Use a HEPA room air filter to remove pollen form the indoor air you breathe.

## 2022-05-05 NOTE — Progress Notes (Signed)
FOLLOW UP Date of Service/Encounter:  05/05/22   Subjective:  Emma Barber (DOB: September 24, 1975) is a 47 y.o. female who [facility] in re-evaluation of the following: Allergic rhinitis and conjunctivitis History obtained from: chart review and patient.  For Review, LV was on 02/06/22  with Dr.Imaan Padgett seen for  [employer] AIT which she tolerated well and has been on allergy injections since . See below for summary of history and diagnostics.   Pertinent History/Diagnostics:  Allergic Rhinitis and conjunctivitis:   rhinorrhea, sneezing, watery eyes, itchy eyes, and itchy nose  Occurs year-round with seasonal flares-spring and fall. No prior ENT surgeries. Singulair makes her feel depressed [employer] AIT started 02/06/22.  - SPT environmental panel (01/15/22): positive to ragweed, marshelder (weed), dust mites, cat dander, dog dander, and outdoor mold (alternaria) IDs positive to indoor molds (penicillium, aspergillus)  Today presents for follow-up. She continues to receive allergy injection and is tolerating them well.  She is due for an injection today. She continues using her allergy medications every day. Flonase if needed. Not very often needing nasal sprays. Her eyes are not watering as much.  She is able to keep her make-up on all day on her eyes which was an issue prior to starting allergy immunotherapy.  She had 1 injection when her arm is a little sore afterward, but otherwise has tolerated them well.  She feels significant improvement since starting injections.   Allergies as of 05/05/2022       Reactions   Montelukast Other (See Comments)   Other Reaction(s): Other (See Comments) Depression        Medication List        Accurate as of May 05, 2022  1:29 PM. If you have any questions, ask your nurse or doctor.          STOP taking these medications    montelukast 10 MG tablet Commonly known as: Singulair Stopped by: Verlee Monte, MD   predniSONE 20 MG tablet Commonly known as: DELTASONE Stopped by: Verlee Monte, MD       TAKE these medications    budesonide 3 MG 24 hr capsule Commonly known as: ENTOCORT EC Take 9 mg by mouth daily.   EPINEPHrine 0.3 mg/0.3 mL Soaj injection Commonly known as: EpiPen 2-Pak Inject 0.3 mg into the muscle as needed for anaphylaxis.   famotidine 20 MG tablet Commonly known as: PEPCID Take 1 tablet (20 mg total) by mouth 2 (two) times daily.   fluticasone 50 MCG/ACT nasal spray Commonly known as: FLONASE Place into the nose.   levocetirizine 5 MG tablet Commonly known as: XYZAL Take 1 tablet by mouth daily.   levonorgestrel 20 MCG/DAY Iud Commonly known as: MIRENA by Intrauterine route.   VITAMIN B COMPLEX PO Take by mouth.       History reviewed. No pertinent past medical history. Past Surgical History:  Procedure Laterality Date   CESAREAN SECTION  09/2009   CHOLECYSTECTOMY     PRK (laser eye surgery)  01/15/2022   WISDOM TOOTH EXTRACTION     Otherwise, there have been no changes to her past medical history, surgical history, family history, or social history.  ROS: All others negative except as noted per HPI.   Objective:  BP 120/70   Pulse 76   Temp 98.1 F (36.7 C) (Temporal)   Resp 18   SpO2 99%  There is no height or weight on file to calculate BMI. Physical  Exam: General Appearance:  Alert, cooperative, no distress, appears stated age  Head:  Normocephalic, without obvious abnormality, atraumatic  Eyes:  Conjunctiva clear, EOM's intact  Nose: Nares normal, hypertrophic turbinates, normal mucosa, and no visible anterior polyps  Throat: Lips, tongue normal; teeth and gums normal, normal posterior oropharynx  Neck: Supple, symmetrical  Lungs:   clear to auscultation bilaterally, Respirations unlabored, no coughing  Heart:  regular rate and rhythm and no murmur, Appears well perfused  Extremities: No edema  Skin: Skin color,  texture, turgor normal and no rashes or lesions on visualized portions of skin  Neurologic: No gross deficits   Assessment/Plan   Seasonal and Perennial Allergic rhinitis: controlled - allergen avoidance towards ragweed, marshelder (weed), dust mites, cat dander, dog dander, and outdoor mold (alternaria), indoor molds - Prevention:  - allergen avoidance when possible - continue allergy injections per protocol, bring updated epipen to your appointments - Symptom control: - Start Nasal Steroid Spray: Best results if used daily. - Options include Flonase (fluticasone), Nasocort (triamcinolone), Nasonex (mometasome) 1- 2 sprays in each nostril daily.  - All can be purchased over-the-counter if not covered by insurance. - Consider Astelin (azelastine) 1-2 sprays in each nostril twice a day as needed for nasal congestion/itchy nose - Continue Antihistamine: daily or daily as needed.   -Options include Zyrtec (Cetirizine) , Claritin (Loratadine) , Allegra (Fexofenadine) , or Xyzal (Levocetirinze)  - Can be purchased over-the-counter if not covered by insurance.  Allergic Conjunctivitis:  - Consider Allergy Eye drops-great options include Pataday (Olopatadine) or Zaditor (ketotifen) for eye symptoms daily as needed-both sold over the counter if not covered by insurance.  -Avoid eye drops that say red eye relief as they may contain medications that dry out your eyes.  Follow up : 6 months, sooner if needed It was a pleasure seeing you again in clinic today! Thank you for allowing me to participate in your care.  Tonny Bollman, MD  [facility]

## 2022-05-12 ENCOUNTER — Ambulatory Visit (INDEPENDENT_AMBULATORY_CARE_PROVIDER_SITE_OTHER): Payer: BC Managed Care – PPO

## 2022-05-12 DIAGNOSIS — J309 Allergic rhinitis, unspecified: Secondary | ICD-10-CM | POA: Diagnosis not present

## 2022-05-19 ENCOUNTER — Ambulatory Visit (INDEPENDENT_AMBULATORY_CARE_PROVIDER_SITE_OTHER): Payer: BC Managed Care – PPO

## 2022-05-19 DIAGNOSIS — J309 Allergic rhinitis, unspecified: Secondary | ICD-10-CM | POA: Diagnosis not present

## 2022-06-01 ENCOUNTER — Ambulatory Visit (INDEPENDENT_AMBULATORY_CARE_PROVIDER_SITE_OTHER): Payer: BC Managed Care – PPO

## 2022-06-01 DIAGNOSIS — J309 Allergic rhinitis, unspecified: Secondary | ICD-10-CM | POA: Diagnosis not present

## 2022-06-10 ENCOUNTER — Ambulatory Visit (INDEPENDENT_AMBULATORY_CARE_PROVIDER_SITE_OTHER): Payer: BC Managed Care – PPO

## 2022-06-10 DIAGNOSIS — J309 Allergic rhinitis, unspecified: Secondary | ICD-10-CM | POA: Diagnosis not present

## 2022-06-15 DIAGNOSIS — J3081 Allergic rhinitis due to animal (cat) (dog) hair and dander: Secondary | ICD-10-CM

## 2022-06-15 NOTE — Progress Notes (Signed)
VIALS EXP 06-15-23 

## 2022-06-17 ENCOUNTER — Ambulatory Visit (INDEPENDENT_AMBULATORY_CARE_PROVIDER_SITE_OTHER): Payer: BC Managed Care – PPO

## 2022-06-17 DIAGNOSIS — J309 Allergic rhinitis, unspecified: Secondary | ICD-10-CM

## 2022-06-23 ENCOUNTER — Ambulatory Visit (INDEPENDENT_AMBULATORY_CARE_PROVIDER_SITE_OTHER): Payer: BC Managed Care – PPO

## 2022-06-23 DIAGNOSIS — J309 Allergic rhinitis, unspecified: Secondary | ICD-10-CM | POA: Diagnosis not present

## 2022-06-30 ENCOUNTER — Ambulatory Visit (INDEPENDENT_AMBULATORY_CARE_PROVIDER_SITE_OTHER): Payer: BC Managed Care – PPO

## 2022-06-30 DIAGNOSIS — J309 Allergic rhinitis, unspecified: Secondary | ICD-10-CM

## 2022-07-08 ENCOUNTER — Ambulatory Visit (INDEPENDENT_AMBULATORY_CARE_PROVIDER_SITE_OTHER): Payer: BC Managed Care – PPO

## 2022-07-08 DIAGNOSIS — J309 Allergic rhinitis, unspecified: Secondary | ICD-10-CM

## 2022-07-14 ENCOUNTER — Ambulatory Visit (INDEPENDENT_AMBULATORY_CARE_PROVIDER_SITE_OTHER): Payer: BC Managed Care – PPO

## 2022-07-14 DIAGNOSIS — J309 Allergic rhinitis, unspecified: Secondary | ICD-10-CM

## 2022-07-27 ENCOUNTER — Ambulatory Visit (INDEPENDENT_AMBULATORY_CARE_PROVIDER_SITE_OTHER): Payer: BC Managed Care – PPO | Admitting: *Deleted

## 2022-07-27 DIAGNOSIS — J309 Allergic rhinitis, unspecified: Secondary | ICD-10-CM | POA: Diagnosis not present

## 2022-08-10 ENCOUNTER — Ambulatory Visit (INDEPENDENT_AMBULATORY_CARE_PROVIDER_SITE_OTHER): Payer: BC Managed Care – PPO

## 2022-08-10 DIAGNOSIS — J309 Allergic rhinitis, unspecified: Secondary | ICD-10-CM

## 2022-08-24 ENCOUNTER — Ambulatory Visit (INDEPENDENT_AMBULATORY_CARE_PROVIDER_SITE_OTHER): Payer: BC Managed Care – PPO

## 2022-08-24 DIAGNOSIS — J309 Allergic rhinitis, unspecified: Secondary | ICD-10-CM | POA: Diagnosis not present

## 2022-08-24 NOTE — Progress Notes (Signed)
Vials Exp 08/24/23

## 2022-08-25 DIAGNOSIS — J3081 Allergic rhinitis due to animal (cat) (dog) hair and dander: Secondary | ICD-10-CM

## 2022-09-01 ENCOUNTER — Ambulatory Visit: Payer: Self-pay

## 2022-09-01 DIAGNOSIS — J309 Allergic rhinitis, unspecified: Secondary | ICD-10-CM

## 2022-09-15 ENCOUNTER — Ambulatory Visit (INDEPENDENT_AMBULATORY_CARE_PROVIDER_SITE_OTHER): Payer: BC Managed Care – PPO

## 2022-09-15 DIAGNOSIS — J309 Allergic rhinitis, unspecified: Secondary | ICD-10-CM

## 2022-09-29 ENCOUNTER — Ambulatory Visit (INDEPENDENT_AMBULATORY_CARE_PROVIDER_SITE_OTHER): Payer: Self-pay

## 2022-09-29 DIAGNOSIS — J309 Allergic rhinitis, unspecified: Secondary | ICD-10-CM | POA: Diagnosis not present

## 2022-10-16 ENCOUNTER — Ambulatory Visit (INDEPENDENT_AMBULATORY_CARE_PROVIDER_SITE_OTHER): Payer: BC Managed Care – PPO

## 2022-10-16 DIAGNOSIS — J309 Allergic rhinitis, unspecified: Secondary | ICD-10-CM | POA: Diagnosis not present

## 2022-10-20 ENCOUNTER — Ambulatory Visit (INDEPENDENT_AMBULATORY_CARE_PROVIDER_SITE_OTHER): Payer: BC Managed Care – PPO

## 2022-10-20 DIAGNOSIS — J309 Allergic rhinitis, unspecified: Secondary | ICD-10-CM

## 2022-10-27 ENCOUNTER — Ambulatory Visit (INDEPENDENT_AMBULATORY_CARE_PROVIDER_SITE_OTHER): Payer: BC Managed Care – PPO

## 2022-10-27 DIAGNOSIS — J309 Allergic rhinitis, unspecified: Secondary | ICD-10-CM

## 2022-11-05 ENCOUNTER — Encounter: Payer: Self-pay | Admitting: Allergy and Immunology

## 2022-11-05 ENCOUNTER — Ambulatory Visit: Payer: BC Managed Care – PPO | Admitting: Allergy and Immunology

## 2022-11-05 ENCOUNTER — Ambulatory Visit: Payer: BC Managed Care – PPO | Admitting: Internal Medicine

## 2022-11-05 VITALS — BP 132/82 | HR 72 | Resp 12 | Ht 63.6 in | Wt 169.2 lb

## 2022-11-05 DIAGNOSIS — H1013 Acute atopic conjunctivitis, bilateral: Secondary | ICD-10-CM

## 2022-11-05 DIAGNOSIS — J3089 Other allergic rhinitis: Secondary | ICD-10-CM

## 2022-11-05 DIAGNOSIS — J302 Other seasonal allergic rhinitis: Secondary | ICD-10-CM | POA: Diagnosis not present

## 2022-11-05 NOTE — Patient Instructions (Addendum)
  1. Never, ever rub or touch eyes. Sports glasses  2. Prednisone 10 mg - 1 tablet 1 time per day for 5 days only  3. Pataday - 1 drop each eye 1 time per day  4. Systane - can use multiple times per day  5. Continue immunotherapy  6. Continue antihistamine and flonase   7. Further evaluation???  8. Follow up with Dr. Maurine Minister in 6 months or earlier if needed

## 2022-11-05 NOTE — Progress Notes (Signed)
[place] [facility] [place] [facility] [place] - [place] - [place]   Follow-up Note  Referring Provider: Laurena Bering, DO Primary Provider: Laurena Bering, DO Date of Office Visit: 11/05/2022  Subjective:   Emma Barber (DOB: 1975-01-25) is a 47 y.o. female who [facility]22 in re-evaluation of the following:  HPI: Emma Barber presents to this clinic in evaluation of allergic rhinoconjunctivitis.  I have never seen her in this clinic and her last visit with Dr. Maurine Minister was 05 May 2022.  She is using immunotherapy currently at every 2-week administration without any adverse effect.  She also uses some cetirizine and some Flonase for her allergic rhinoconjunctivitis.  Her use of Flonase is relatively rare.  She is here today with a complaint that over the course of the past 2 days she has developed red irritated eyes that are itchy and she does rub her eyes.  She has not had any change in visual acuity.  She has no other associated systemic or constitutional symptoms.  She has not had any ugly drainage from her eyes.  She has had some outdoor exposure recently.  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  She has not really had a significant change in her environment other than some outdoor exposure.  Allergies as of 11/05/2022       Reactions   Montelukast Other (See Comments)   Other Reaction(s): Other (See Comments) Depression        Medication List    EPINEPHrine 0.3 mg/0.3 mL Soaj injection Commonly known as: EpiPen 2-Pak Inject 0.3 mg into the muscle as needed for anaphylaxis.   famotidine 20 MG tablet Commonly known as: PEPCID Take 1 tablet (20 mg total) by mouth 2 (two) times daily.   fluticasone 50 MCG/ACT nasal spray Commonly known as: FLONASE Place into the nose.   levocetirizine 5 MG tablet Commonly known as: XYZAL Take 1 tablet by mouth daily.    levonorgestrel 20 MCG/DAY Iud Commonly known as: MIRENA by Intrauterine route.   MULTIVITAMIN PO Take by mouth daily.   VITAMIN B COMPLEX PO Take by mouth.    Past Medical History:  Diagnosis Date   Allergic rhinitis     Past Surgical History:  Procedure Laterality Date   CESAREAN SECTION  09/2009   CHOLECYSTECTOMY     PRK (laser eye surgery)  01/15/2022   WISDOM TOOTH EXTRACTION      Review of systems negative except as noted in HPI / PMHx or noted below:  Review of Systems  Constitutional: Negative.   HENT: Negative.    Eyes: Negative.   Respiratory: Negative.    Cardiovascular: Negative.   Gastrointestinal: Negative.   Genitourinary: Negative.   Musculoskeletal: Negative.   Skin: Negative.   Neurological: Negative.   Endo/Heme/Allergies: Negative.   Psychiatric/Behavioral: Negative.       Objective:   Vitals:   11/05/22 1600  BP: 132/82  Pulse: 72  Resp: 12  SpO2: 98%   Height: 5' 3.6" (161.5 cm)  Weight: 169 lb 3.2 oz (76.7 kg)   Physical Exam Constitutional:      Appearance: She is not diaphoretic.  HENT:     Head: Normocephalic.     Right Ear: Tympanic membrane, ear canal and external ear normal.     Left Ear: Tympanic membrane, ear canal and external ear normal.     Nose: Nose  normal. No mucosal edema or rhinorrhea.     Mouth/Throat:     Pharynx: Uvula midline. No oropharyngeal exudate.  Eyes:     Conjunctiva/sclera:     Right eye: Right conjunctiva is injected.     Left eye: Left conjunctiva is injected.  Neck:     Thyroid: No thyromegaly.     Trachea: Trachea normal. No tracheal tenderness or tracheal deviation.  Cardiovascular:     Rate and Rhythm: Normal rate and regular rhythm.     Heart sounds: Normal heart sounds, S1 normal and S2 normal. No murmur heard. Pulmonary:     Effort: No respiratory distress.     Breath sounds: Normal breath sounds. No stridor. No wheezing or rales.  Lymphadenopathy:     Head:     Right side of  head: No tonsillar adenopathy.     Left side of head: No tonsillar adenopathy.     Cervical: No cervical adenopathy.  Skin:    Findings: No erythema or rash.     Nails: There is no clubbing.  Neurological:     Mental Status: She is alert.     Diagnostics: none   Assessment and Plan:   1. Seasonal and perennial allergic rhinitis   2. Allergic conjunctivitis of both eyes     1. Never, ever rub or touch eyes. Sports glasses  2. Prednisone 10 mg - 1 tablet 1 time per day for 5 days only  3. Pataday - 1 drop each eye 1 time per day  4. Systane - can use multiple times per day  5. Continue immunotherapy  6. Continue antihistamine and flonase   7. Further evaluation???  8. Follow up with Dr. Maurine Minister in 6 months or earlier if needed  Emma Barber will use the plan noted above to help with her inflamed eyes with unknown etiologic factor although possibly atopic trigger.  Assuming she does well she will follow-up with Dr. Maurine Minister in 6 months.  If this plan should be an efficient in taking care of her issue with her eyes she will contact me for further evaluation and treatment.  Emma Schimke, MD Allergy / Immunology [facility]

## 2022-11-09 ENCOUNTER — Encounter: Payer: Self-pay | Admitting: Allergy and Immunology

## 2022-11-10 ENCOUNTER — Ambulatory Visit (INDEPENDENT_AMBULATORY_CARE_PROVIDER_SITE_OTHER): Payer: BC Managed Care – PPO

## 2022-11-10 DIAGNOSIS — J309 Allergic rhinitis, unspecified: Secondary | ICD-10-CM | POA: Diagnosis not present

## 2022-11-17 ENCOUNTER — Ambulatory Visit (INDEPENDENT_AMBULATORY_CARE_PROVIDER_SITE_OTHER): Payer: BC Managed Care – PPO

## 2022-11-17 DIAGNOSIS — J309 Allergic rhinitis, unspecified: Secondary | ICD-10-CM | POA: Diagnosis not present

## 2022-12-01 ENCOUNTER — Ambulatory Visit (INDEPENDENT_AMBULATORY_CARE_PROVIDER_SITE_OTHER): Payer: BC Managed Care – PPO

## 2022-12-01 DIAGNOSIS — J309 Allergic rhinitis, unspecified: Secondary | ICD-10-CM | POA: Diagnosis not present

## 2022-12-15 ENCOUNTER — Ambulatory Visit (INDEPENDENT_AMBULATORY_CARE_PROVIDER_SITE_OTHER): Payer: Self-pay

## 2022-12-15 DIAGNOSIS — J309 Allergic rhinitis, unspecified: Secondary | ICD-10-CM

## 2022-12-28 ENCOUNTER — Ambulatory Visit (INDEPENDENT_AMBULATORY_CARE_PROVIDER_SITE_OTHER): Payer: Self-pay

## 2022-12-28 DIAGNOSIS — J309 Allergic rhinitis, unspecified: Secondary | ICD-10-CM | POA: Diagnosis not present

## 2023-01-11 DIAGNOSIS — J3081 Allergic rhinitis due to animal (cat) (dog) hair and dander: Secondary | ICD-10-CM | POA: Diagnosis not present

## 2023-01-11 NOTE — Progress Notes (Signed)
VIALS EXP 01-11-24

## 2023-01-14 ENCOUNTER — Ambulatory Visit (INDEPENDENT_AMBULATORY_CARE_PROVIDER_SITE_OTHER): Payer: 59

## 2023-01-14 DIAGNOSIS — J309 Allergic rhinitis, unspecified: Secondary | ICD-10-CM

## 2023-02-09 ENCOUNTER — Ambulatory Visit (INDEPENDENT_AMBULATORY_CARE_PROVIDER_SITE_OTHER): Payer: Self-pay

## 2023-02-09 DIAGNOSIS — J309 Allergic rhinitis, unspecified: Secondary | ICD-10-CM

## 2023-03-08 ENCOUNTER — Ambulatory Visit (INDEPENDENT_AMBULATORY_CARE_PROVIDER_SITE_OTHER): Payer: Self-pay

## 2023-03-08 DIAGNOSIS — J309 Allergic rhinitis, unspecified: Secondary | ICD-10-CM

## 2023-03-16 ENCOUNTER — Ambulatory Visit (INDEPENDENT_AMBULATORY_CARE_PROVIDER_SITE_OTHER): Payer: Self-pay

## 2023-03-16 DIAGNOSIS — J309 Allergic rhinitis, unspecified: Secondary | ICD-10-CM | POA: Diagnosis not present

## 2023-03-29 ENCOUNTER — Ambulatory Visit (INDEPENDENT_AMBULATORY_CARE_PROVIDER_SITE_OTHER): Payer: Self-pay

## 2023-03-29 DIAGNOSIS — J309 Allergic rhinitis, unspecified: Secondary | ICD-10-CM | POA: Diagnosis not present

## 2023-04-26 ENCOUNTER — Ambulatory Visit (INDEPENDENT_AMBULATORY_CARE_PROVIDER_SITE_OTHER): Payer: Self-pay

## 2023-04-26 DIAGNOSIS — J309 Allergic rhinitis, unspecified: Secondary | ICD-10-CM | POA: Diagnosis not present

## 2023-06-01 ENCOUNTER — Ambulatory Visit (INDEPENDENT_AMBULATORY_CARE_PROVIDER_SITE_OTHER)

## 2023-06-01 DIAGNOSIS — J309 Allergic rhinitis, unspecified: Secondary | ICD-10-CM

## 2023-06-29 ENCOUNTER — Ambulatory Visit (INDEPENDENT_AMBULATORY_CARE_PROVIDER_SITE_OTHER)

## 2023-06-29 DIAGNOSIS — J309 Allergic rhinitis, unspecified: Secondary | ICD-10-CM

## 2023-07-22 NOTE — Progress Notes (Signed)
 VIALS TO BE MADE 07-26-23

## 2023-07-26 ENCOUNTER — Ambulatory Visit (INDEPENDENT_AMBULATORY_CARE_PROVIDER_SITE_OTHER): Payer: Self-pay

## 2023-07-26 DIAGNOSIS — J309 Allergic rhinitis, unspecified: Secondary | ICD-10-CM

## 2023-07-27 DIAGNOSIS — J3081 Allergic rhinitis due to animal (cat) (dog) hair and dander: Secondary | ICD-10-CM | POA: Diagnosis not present

## 2023-07-28 DIAGNOSIS — J3089 Other allergic rhinitis: Secondary | ICD-10-CM | POA: Diagnosis not present

## 2023-08-18 ENCOUNTER — Ambulatory Visit (INDEPENDENT_AMBULATORY_CARE_PROVIDER_SITE_OTHER): Payer: Self-pay

## 2023-08-18 DIAGNOSIS — J309 Allergic rhinitis, unspecified: Secondary | ICD-10-CM

## 2023-09-14 ENCOUNTER — Ambulatory Visit (INDEPENDENT_AMBULATORY_CARE_PROVIDER_SITE_OTHER): Payer: Self-pay

## 2023-09-14 DIAGNOSIS — J309 Allergic rhinitis, unspecified: Secondary | ICD-10-CM | POA: Diagnosis not present

## 2023-10-14 ENCOUNTER — Ambulatory Visit: Payer: Self-pay

## 2023-10-14 DIAGNOSIS — J309 Allergic rhinitis, unspecified: Secondary | ICD-10-CM

## 2023-10-20 ENCOUNTER — Ambulatory Visit (INDEPENDENT_AMBULATORY_CARE_PROVIDER_SITE_OTHER)

## 2023-10-20 DIAGNOSIS — J309 Allergic rhinitis, unspecified: Secondary | ICD-10-CM | POA: Diagnosis not present

## 2023-10-26 ENCOUNTER — Ambulatory Visit: Payer: Self-pay

## 2023-10-26 DIAGNOSIS — J309 Allergic rhinitis, unspecified: Secondary | ICD-10-CM

## 2023-11-23 ENCOUNTER — Ambulatory Visit (INDEPENDENT_AMBULATORY_CARE_PROVIDER_SITE_OTHER)

## 2023-11-23 DIAGNOSIS — J309 Allergic rhinitis, unspecified: Secondary | ICD-10-CM

## 2023-12-21 ENCOUNTER — Ambulatory Visit

## 2023-12-21 DIAGNOSIS — J309 Allergic rhinitis, unspecified: Secondary | ICD-10-CM

## 2024-01-18 ENCOUNTER — Ambulatory Visit (INDEPENDENT_AMBULATORY_CARE_PROVIDER_SITE_OTHER)

## 2024-01-18 DIAGNOSIS — J302 Other seasonal allergic rhinitis: Secondary | ICD-10-CM
